# Patient Record
Sex: Female | Born: 1985 | Race: White | Hispanic: No | Marital: Single | Smoking: Never smoker
Health system: Southern US, Community
[De-identification: ages and names within clinical notes are randomized; demographics above are authoritative.]

## PROBLEM LIST (undated history)

## (undated) DIAGNOSIS — IMO0001 Reserved for inherently not codable concepts without codable children: Secondary | ICD-10-CM

## (undated) DIAGNOSIS — K219 Gastro-esophageal reflux disease without esophagitis: Secondary | ICD-10-CM

## (undated) HISTORY — DX: Reserved for inherently not codable concepts without codable children: IMO0001

## (undated) HISTORY — DX: Gastro-esophageal reflux disease without esophagitis: K21.9

---

## 2014-01-21 ENCOUNTER — Ambulatory Visit: Payer: BC Managed Care – PPO

## 2014-01-21 ENCOUNTER — Ambulatory Visit (INDEPENDENT_AMBULATORY_CARE_PROVIDER_SITE_OTHER): Payer: BC Managed Care – PPO | Admitting: Family Medicine

## 2014-01-21 VITALS — BP 110/70 | HR 88 | Temp 98.6°F | Resp 16 | Ht 62.5 in | Wt 109.5 lb

## 2014-01-21 DIAGNOSIS — M25561 Pain in right knee: Secondary | ICD-10-CM

## 2014-01-21 DIAGNOSIS — S139XXA Sprain of joints and ligaments of unspecified parts of neck, initial encounter: Secondary | ICD-10-CM

## 2014-01-21 DIAGNOSIS — M25562 Pain in left knee: Secondary | ICD-10-CM

## 2014-01-21 DIAGNOSIS — S20219A Contusion of unspecified front wall of thorax, initial encounter: Secondary | ICD-10-CM

## 2014-01-21 DIAGNOSIS — S161XXA Strain of muscle, fascia and tendon at neck level, initial encounter: Secondary | ICD-10-CM

## 2014-01-21 DIAGNOSIS — M542 Cervicalgia: Secondary | ICD-10-CM

## 2014-01-21 DIAGNOSIS — M62838 Other muscle spasm: Secondary | ICD-10-CM

## 2014-01-21 DIAGNOSIS — S8010XA Contusion of unspecified lower leg, initial encounter: Secondary | ICD-10-CM

## 2014-01-21 DIAGNOSIS — M79605 Pain in left leg: Secondary | ICD-10-CM

## 2014-01-21 DIAGNOSIS — M79609 Pain in unspecified limb: Secondary | ICD-10-CM

## 2014-01-21 DIAGNOSIS — M25569 Pain in unspecified knee: Secondary | ICD-10-CM

## 2014-01-21 MED ORDER — CYCLOBENZAPRINE HCL 5 MG PO TABS: ORAL_TABLET | ORAL | Status: AC
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
## 2014-08-13 IMAGING — CR DG TIBIA/FIBULA 2V*L*
2 series · 2 of 2 positions shown · non-contrast
Comparison: None.

CLINICAL DATA: Left lower leg injury, pain.

EXAM:
LEFT TIBIA AND FIBULA - 2 VIEW

[AP]
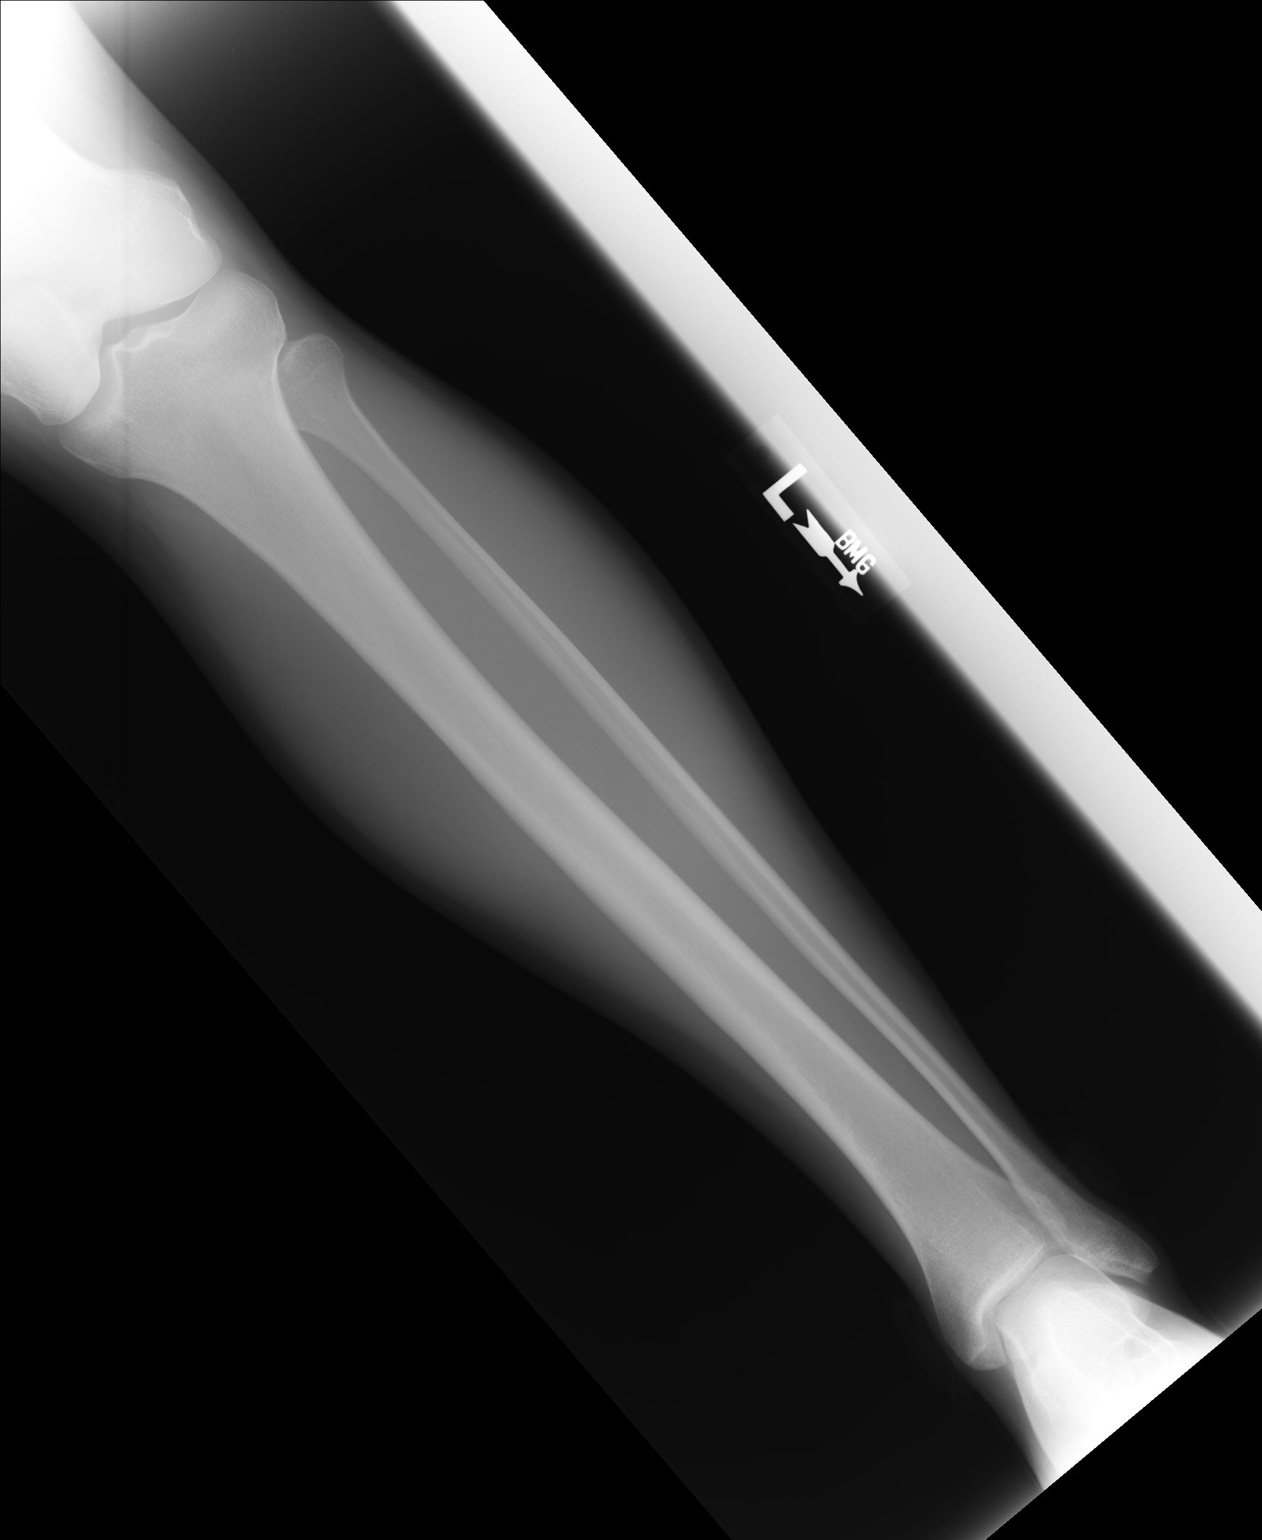

[lateral]
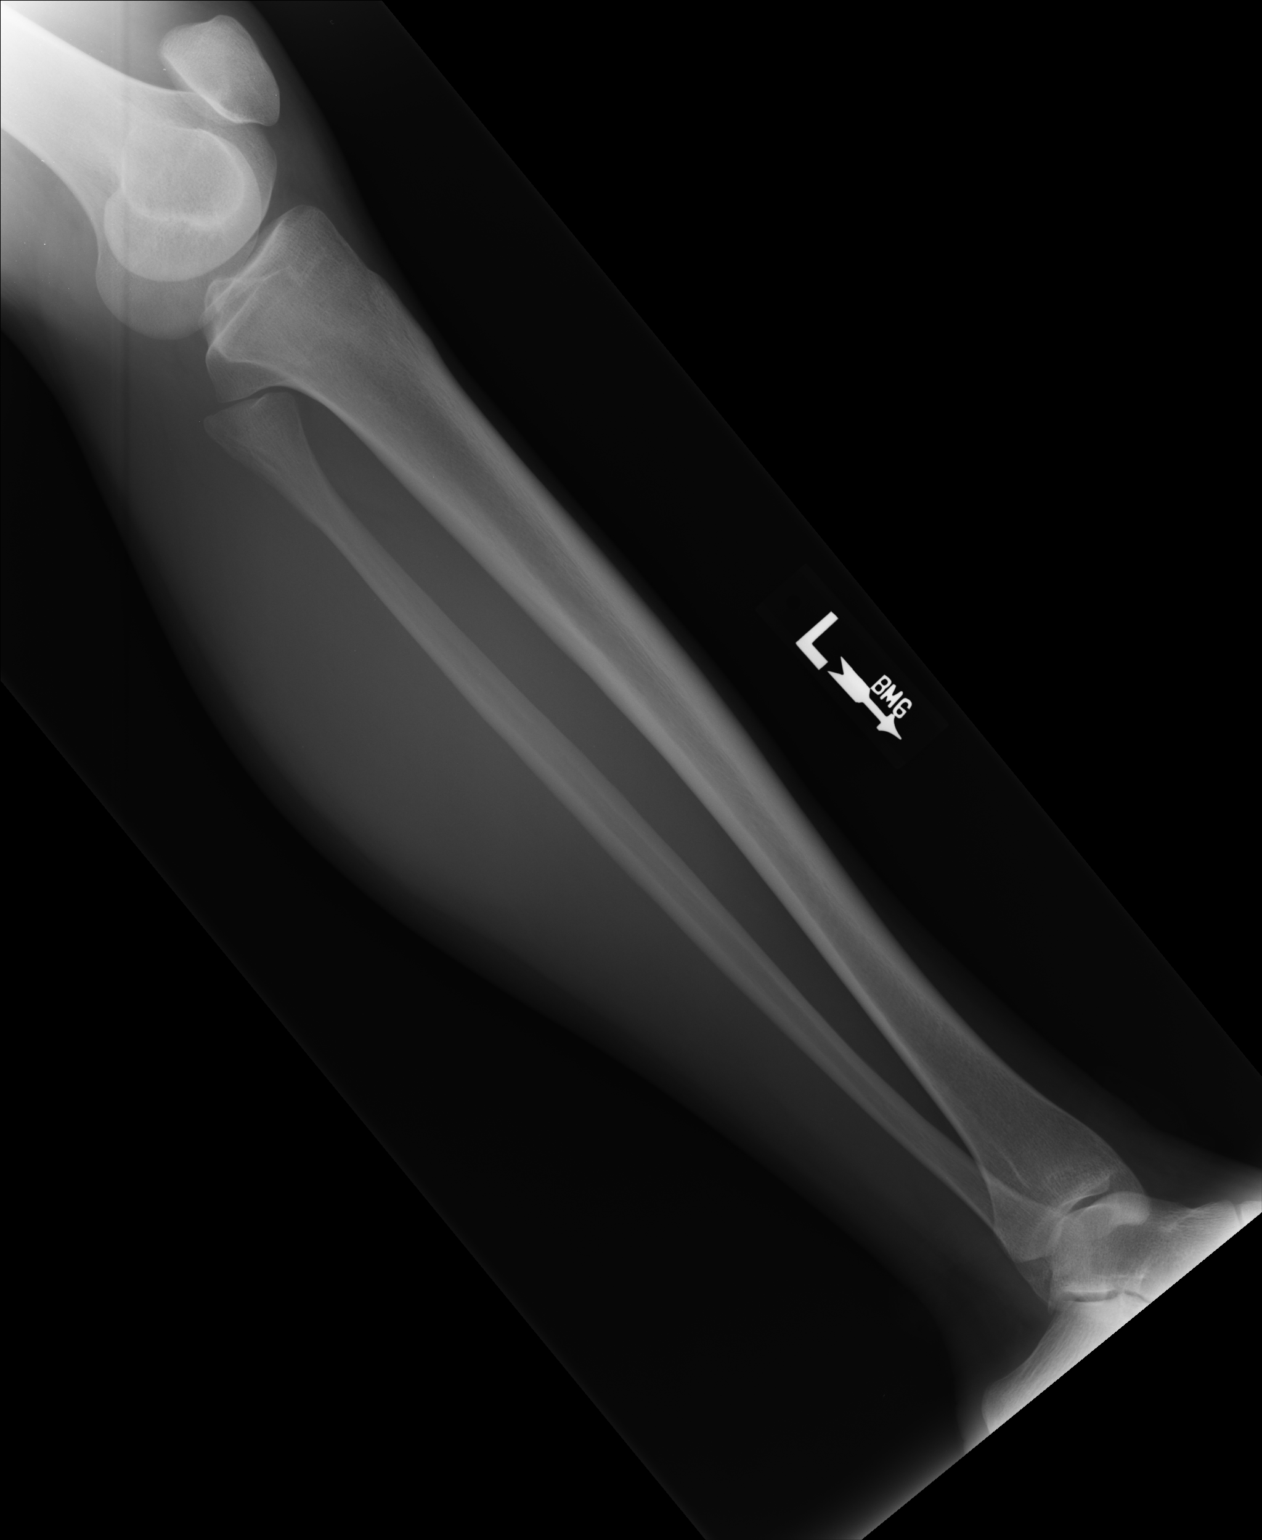

[2 of 2 positions shown; findings below may reference images not displayed]

FINDINGS: Imaged bones, joints and soft tissues appear normal.
IMPRESSION: Negative exam.

## 2014-08-13 IMAGING — CR DG CERVICAL SPINE COMPLETE 4+V
7 series · 7 of 7 positions shown · non-contrast
Comparison: None.

CLINICAL DATA: Neck injury

EXAM:
CERVICAL SPINE  4+ VIEWS

[lateral]
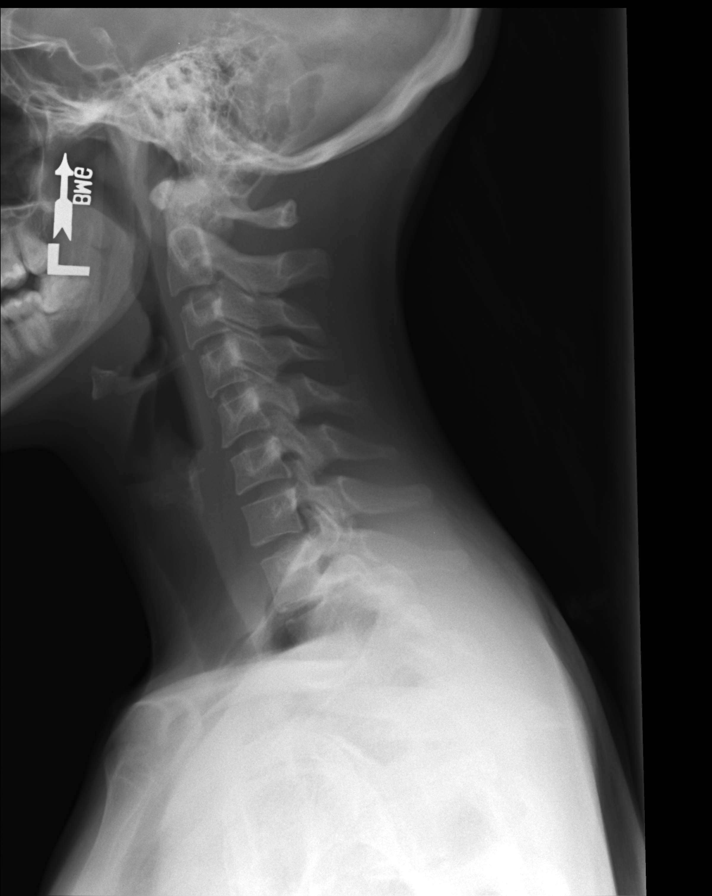

[AP]
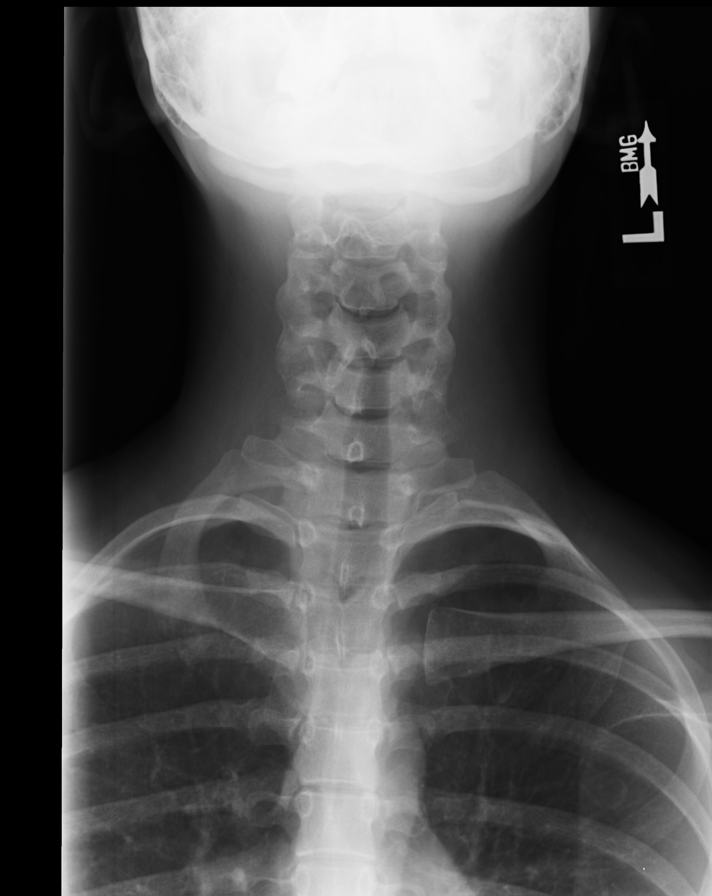

[ap open mouth]
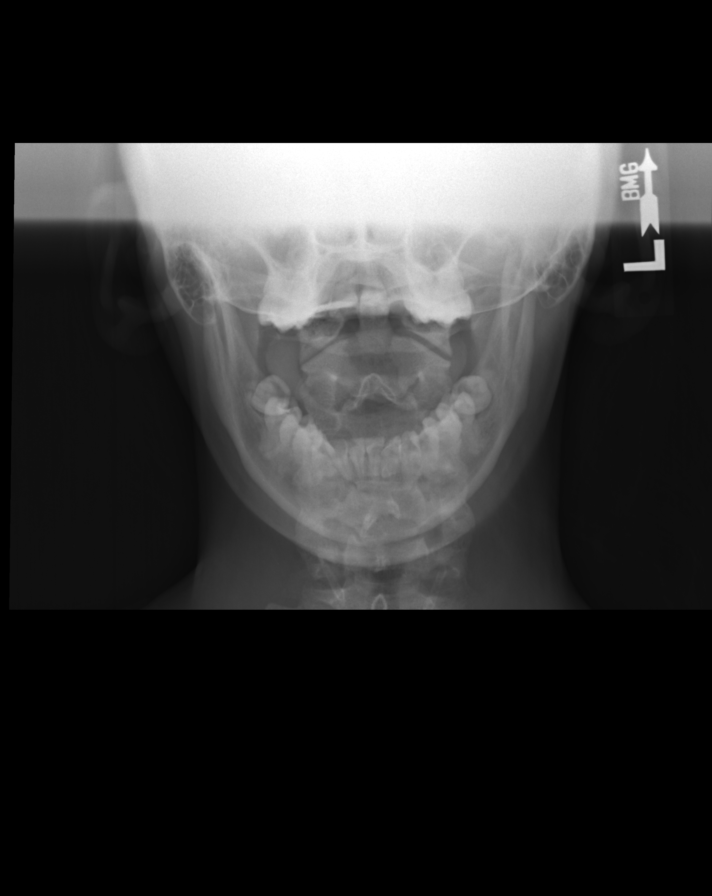

[lpo]
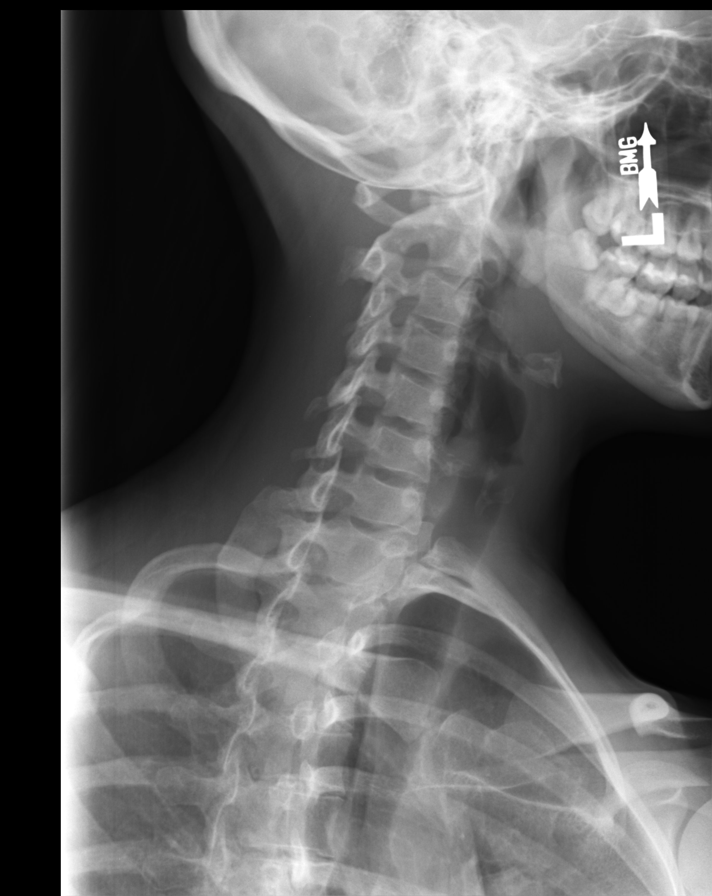

[rpo]
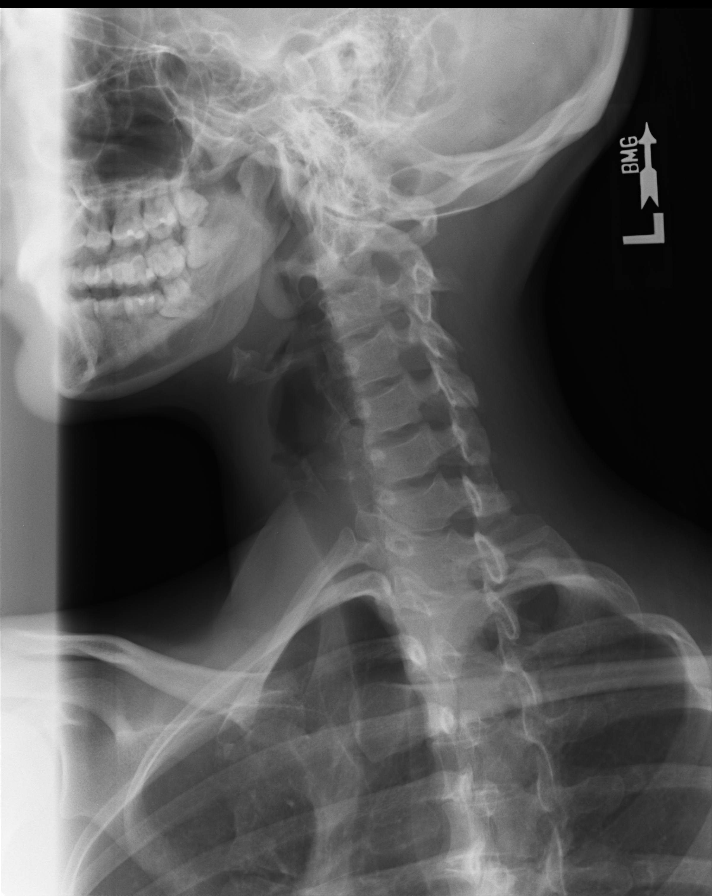

[lateral flex]
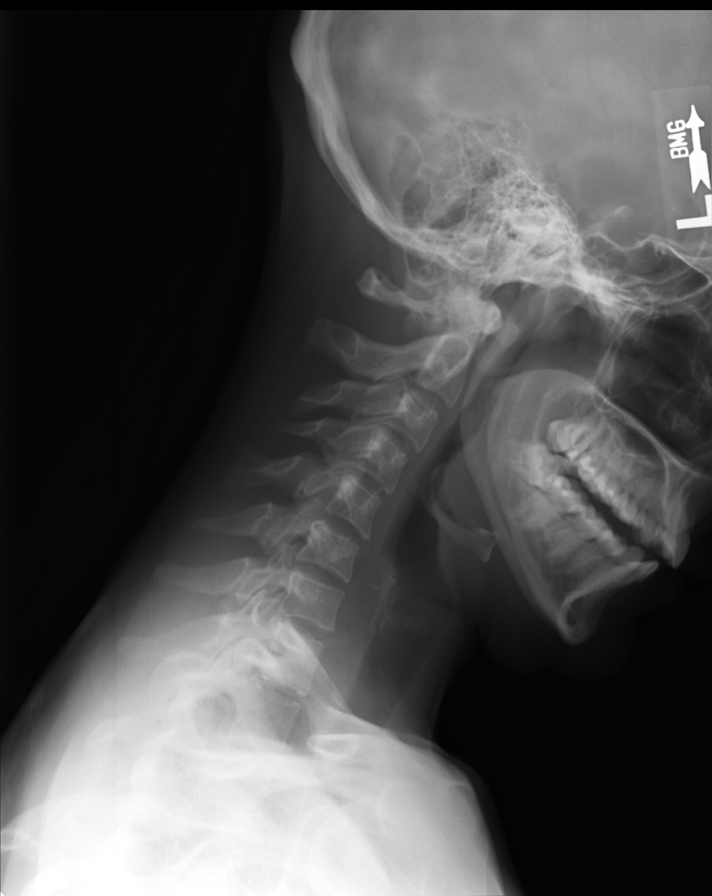

[lateral ext]
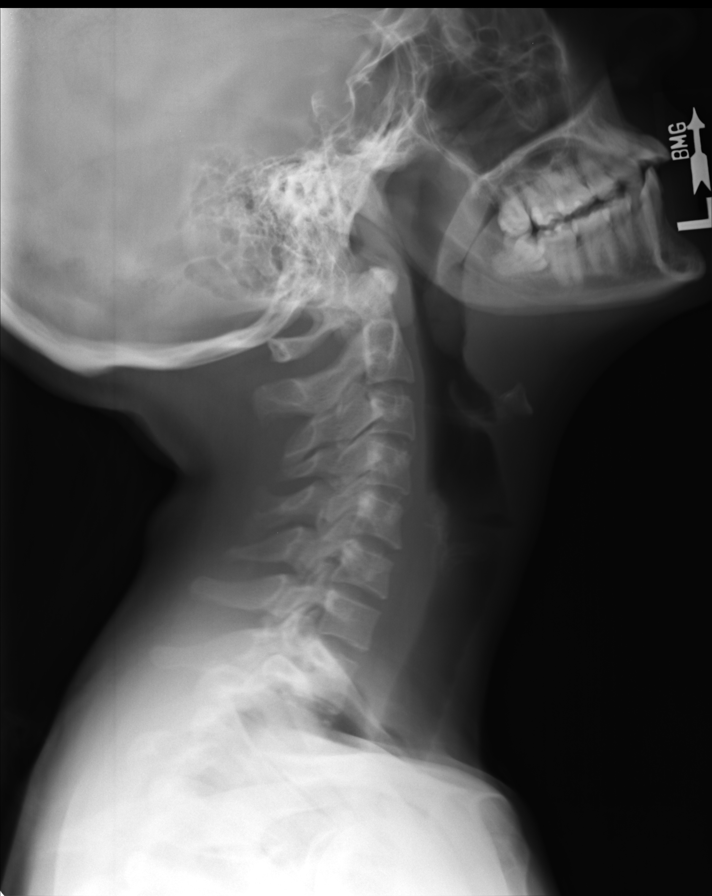

[7 of 7 positions shown; findings below may reference images not displayed]

FINDINGS: There is straightening of the normal cervical lordosis. No
prevertebral soft tissue thickening. No loss of vertebral body
height. Oblique projections demonstrate no traumatic neural
foraminal narrowing. Normal facet articulation. Open mouth odontoid
view demonstrates normal alignment of lateral masses of C1 on C2.
With flexion extension maneuvers, there is no evidence of
subluxation.
IMPRESSION: No radiographic evidence cervical spine injury.

## 2017-07-12 ENCOUNTER — Encounter: Payer: Self-pay | Admitting: Physician Assistant

## 2017-07-12 ENCOUNTER — Ambulatory Visit (INDEPENDENT_AMBULATORY_CARE_PROVIDER_SITE_OTHER): Payer: Managed Care, Other (non HMO) | Admitting: Physician Assistant

## 2017-07-12 VITALS — BP 120/79 | HR 98 | Temp 99.6°F | Resp 16 | Ht 62.5 in | Wt 132.4 lb

## 2017-07-12 DIAGNOSIS — H6123 Impacted cerumen, bilateral: Secondary | ICD-10-CM | POA: Diagnosis not present

## 2017-07-12 DIAGNOSIS — H9191 Unspecified hearing loss, right ear: Secondary | ICD-10-CM | POA: Diagnosis not present

## 2017-07-12 NOTE — Progress Notes (Signed)
   Debra BerberSamantha Stitely  MRN: [id number] DOB: 11-12-1986  PCP: Debra Orozco, No Pcp Per  Subjective:  Pt is a pleasant 31 year old female who presents to clinic for ear fullness. Feels like she is in an airplane x 1 week. Today endorses a "popping" noise after she took a bath. Feels a little "off balance" but not dizzy. She has tried Hydrogen peroxide rinse, not helping. Denies ringing in her ears, pain, ear drainage, fever, chills.   Review of Systems  Constitutional: Negative for chills and fever.  HENT: Negative for ear discharge and ear pain.   Neurological: Negative for dizziness.    There are no active problems to display for this Debra Orozco.   Current Outpatient Prescriptions on File Prior to Visit  Medication Sig Dispense Refill  . cyclobenzaprine (FLEXERIL) 5 MG tablet 1 pill by mouth up to every 8 hours as needed. Start with one pill by mouth each bedtime as needed due to sedation (Debra Orozco not taking: Reported on 07/12/2017) 15 tablet 0  . etonogestrel-ethinyl estradiol (NUVARING) 0.12-0.015 MG/24HR vaginal ring Place 1 each vaginally every 28 (twenty-eight) days. Insert vaginally and leave in place for 3 consecutive weeks, then remove for 1 week.     No current facility-administered medications on file prior to visit.     No Known Allergies   Objective:  BP 120/79   Pulse 98   Temp 99.6 F (37.6 C) (Oral)   Resp 16   Ht 5' 2.5" (1.588 m)   Wt 132 lb 6.4 oz (60.1 kg)   SpO2 98%   BMI 23.83 kg/m   Physical Exam  Constitutional: She is well-developed, well-nourished, and in no distress.  HENT:  B/l cerumen impaction. Decreased hearing right ear.  After b/l ear lavage TM's visualized and are pearly gray, no erythema, not bulging. Hearing improved.   Skin: Skin is warm and dry. She is not diaphoretic.  Psychiatric: Mood, memory, affect and judgment normal.    Assessment and Plan :  1. Bilateral impacted cerumen 2. Decreased hearing of right ear - Ear wax removal - Complete  improvement of symptoms following b/l ear lavage. Ear hygiene printed out and discussed with pt. RTC PRN.   Marco CollieWhitney Ellisha Bankson, PA-C  [facility] 07/12/2017 5:18 PM

## 2017-07-12 NOTE — Patient Instructions (Addendum)
Thank you for coming in today. I hope you feel we met your needs.  Feel free to call PCP if you have any questions or further requests.  Please consider signing up for MyChart if you do not already have it, as this is a great Serda to communicate with me.  Best,  [employer], PA-C   Please do not use Q-tips, as this will further impact the ear wax in your ear.   If you have cerumen impaction more than once a year you can reduce the occurrences by using a cotton ball dipped in mineral oil and place it in the external canal for 10-20 minutes once a week (combined with eight hours of not using a hearing aid overnight, if applicable). This helps liquify cerumen and aid in the normal elimination mechanisms.    Routine cleaning of ears by a health professional every 6-12 months is recommended.   If you have itchy ears, use sweet oil. If you need more relief use a small amount of hydrocortisone ointment.   IF you received an x-ray today, you will receive an invoice from [facility]. Please contact [facility] at [phone] with questions or concerns regarding your invoice.   IF you received labwork today, you will receive an invoice from [facility]. Please contact [facility] at [phone] with questions or concerns regarding your invoice.   Our billing staff will not be able to assist you with questions regarding bills from these companies.  You will be contacted with the lab results as soon as they are available. The fastest [place] to get your results is to activate your My Chart account. Instructions are located on the last page of this paperwork. If you have not heard from [place] regarding the results in 2 weeks, please contact this office.
